# Patient Record
Sex: Female | Born: 1955 | Race: Asian | Hispanic: No | Marital: Married | Smoking: Never smoker
Health system: Southern US, Community
[De-identification: ages and names within clinical notes are randomized; demographics above are authoritative.]

## PROBLEM LIST (undated history)

## (undated) DIAGNOSIS — E119 Type 2 diabetes mellitus without complications: Secondary | ICD-10-CM

## (undated) DIAGNOSIS — I10 Essential (primary) hypertension: Secondary | ICD-10-CM

## (undated) DIAGNOSIS — E7800 Pure hypercholesterolemia, unspecified: Secondary | ICD-10-CM

## (undated) HISTORY — PX: ABDOMINAL HYSTERECTOMY: SHX81

---

## 2009-08-15 HISTORY — PX: CORONARY ARTERY BYPASS GRAFT: SHX141

## 2009-10-11 ENCOUNTER — Inpatient Hospital Stay (HOSPITAL_COMMUNITY): Admission: EM | Admit: 2009-10-11 | Discharge: 2009-10-16 | Payer: Self-pay | Admitting: Emergency Medicine

## 2009-10-14 ENCOUNTER — Ambulatory Visit: Payer: Self-pay | Admitting: Cardiothoracic Surgery

## 2009-10-15 ENCOUNTER — Encounter: Payer: Self-pay | Admitting: Cardiothoracic Surgery

## 2009-10-16 ENCOUNTER — Encounter: Payer: Self-pay | Admitting: Cardiothoracic Surgery

## 2009-11-19 ENCOUNTER — Ambulatory Visit: Payer: Self-pay | Admitting: Cardiothoracic Surgery

## 2009-12-16 ENCOUNTER — Ambulatory Visit: Payer: Self-pay | Admitting: Cardiothoracic Surgery

## 2009-12-30 ENCOUNTER — Ambulatory Visit: Payer: Self-pay | Admitting: Cardiothoracic Surgery

## 2009-12-30 ENCOUNTER — Inpatient Hospital Stay (HOSPITAL_COMMUNITY): Admission: RE | Admit: 2009-12-30 | Discharge: 2010-01-07 | Payer: Self-pay | Admitting: Cardiothoracic Surgery

## 2010-01-28 ENCOUNTER — Ambulatory Visit: Payer: Self-pay | Admitting: Cardiothoracic Surgery

## 2010-01-28 ENCOUNTER — Encounter: Admission: RE | Admit: 2010-01-28 | Discharge: 2010-01-28 | Payer: Self-pay | Admitting: Cardiothoracic Surgery

## 2010-08-18 IMAGING — CR DG CHEST 2V
2 series · 2 of 2 positions shown · non-contrast
Comparison: Chest radiograph [DATE]

CLINICAL DATA: CAD, CABG

CHEST - 2 VIEW

[w chest pa]
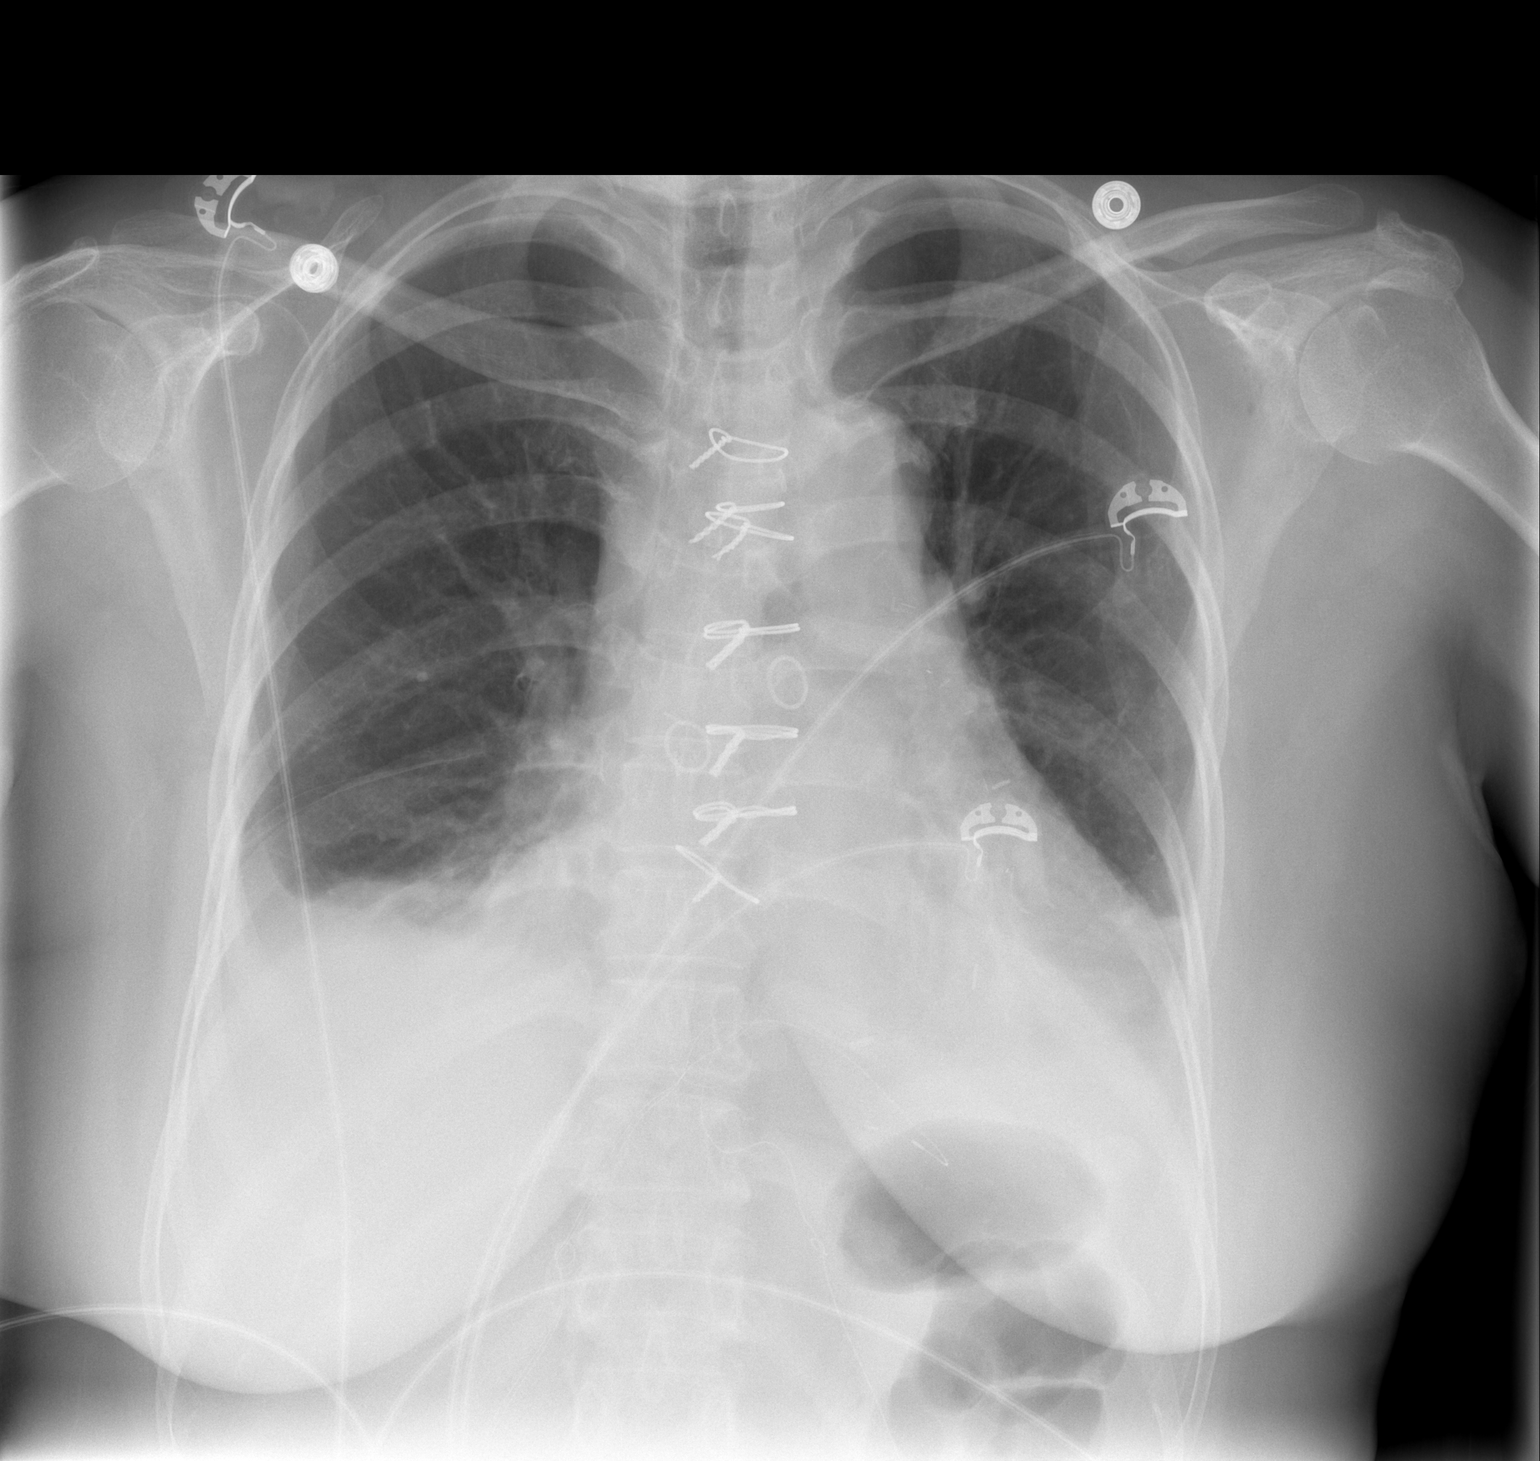

[w chest lat]
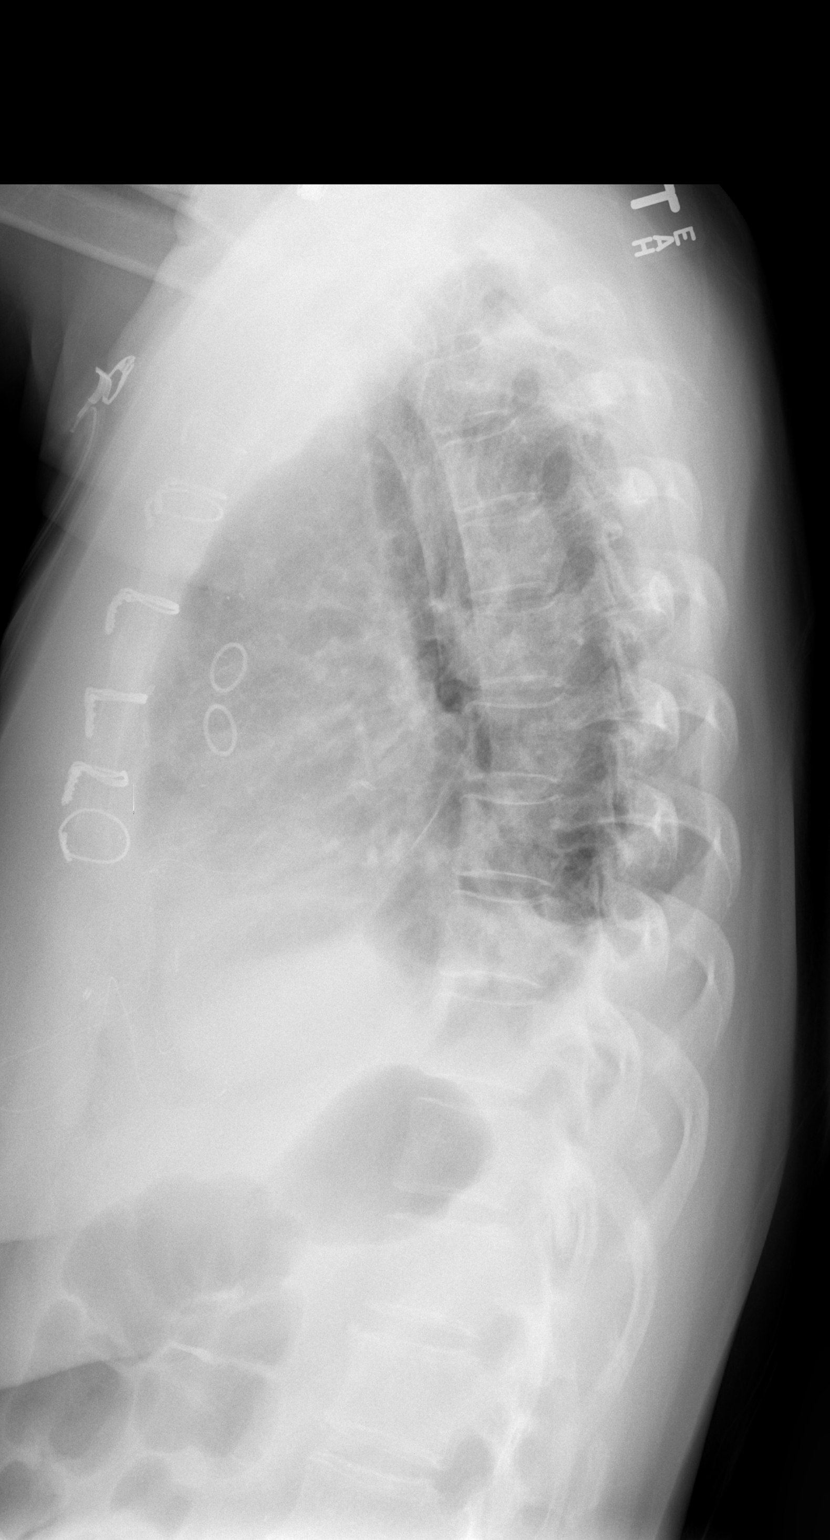

[2 of 2 positions shown; findings below may reference images not displayed]

FINDINGS: Sternotomy wires overlie stable cardiac silhouette.
Interval removal of IJ sheath.  There is improved aeration to the
lung bases.  Persistent bilateral effusions.  No pneumothorax.
IMPRESSION: Improved aeration to the lung bases.

## 2010-11-01 LAB — GLUCOSE, CAPILLARY
Glucose-Capillary: 100 mg/dL — ABNORMAL HIGH (ref 70–99)
Glucose-Capillary: 105 mg/dL — ABNORMAL HIGH (ref 70–99)
Glucose-Capillary: 108 mg/dL — ABNORMAL HIGH (ref 70–99)
Glucose-Capillary: 116 mg/dL — ABNORMAL HIGH (ref 70–99)
Glucose-Capillary: 117 mg/dL — ABNORMAL HIGH (ref 70–99)
Glucose-Capillary: 121 mg/dL — ABNORMAL HIGH (ref 70–99)
Glucose-Capillary: 122 mg/dL — ABNORMAL HIGH (ref 70–99)
Glucose-Capillary: 126 mg/dL — ABNORMAL HIGH (ref 70–99)
Glucose-Capillary: 131 mg/dL — ABNORMAL HIGH (ref 70–99)
Glucose-Capillary: 131 mg/dL — ABNORMAL HIGH (ref 70–99)
Glucose-Capillary: 133 mg/dL — ABNORMAL HIGH (ref 70–99)
Glucose-Capillary: 134 mg/dL — ABNORMAL HIGH (ref 70–99)
Glucose-Capillary: 136 mg/dL — ABNORMAL HIGH (ref 70–99)
Glucose-Capillary: 136 mg/dL — ABNORMAL HIGH (ref 70–99)
Glucose-Capillary: 137 mg/dL — ABNORMAL HIGH (ref 70–99)
Glucose-Capillary: 137 mg/dL — ABNORMAL HIGH (ref 70–99)
Glucose-Capillary: 140 mg/dL — ABNORMAL HIGH (ref 70–99)
Glucose-Capillary: 141 mg/dL — ABNORMAL HIGH (ref 70–99)
Glucose-Capillary: 143 mg/dL — ABNORMAL HIGH (ref 70–99)
Glucose-Capillary: 144 mg/dL — ABNORMAL HIGH (ref 70–99)
Glucose-Capillary: 146 mg/dL — ABNORMAL HIGH (ref 70–99)
Glucose-Capillary: 154 mg/dL — ABNORMAL HIGH (ref 70–99)
Glucose-Capillary: 155 mg/dL — ABNORMAL HIGH (ref 70–99)
Glucose-Capillary: 157 mg/dL — ABNORMAL HIGH (ref 70–99)
Glucose-Capillary: 162 mg/dL — ABNORMAL HIGH (ref 70–99)
Glucose-Capillary: 167 mg/dL — ABNORMAL HIGH (ref 70–99)
Glucose-Capillary: 176 mg/dL — ABNORMAL HIGH (ref 70–99)
Glucose-Capillary: 177 mg/dL — ABNORMAL HIGH (ref 70–99)
Glucose-Capillary: 180 mg/dL — ABNORMAL HIGH (ref 70–99)
Glucose-Capillary: 183 mg/dL — ABNORMAL HIGH (ref 70–99)
Glucose-Capillary: 235 mg/dL — ABNORMAL HIGH (ref 70–99)
Glucose-Capillary: 239 mg/dL — ABNORMAL HIGH (ref 70–99)
Glucose-Capillary: 25 mg/dL — CL (ref 70–99)
Glucose-Capillary: 50 mg/dL — ABNORMAL LOW (ref 70–99)
Glucose-Capillary: 62 mg/dL — ABNORMAL LOW (ref 70–99)
Glucose-Capillary: 63 mg/dL — ABNORMAL LOW (ref 70–99)
Glucose-Capillary: 65 mg/dL — ABNORMAL LOW (ref 70–99)
Glucose-Capillary: 80 mg/dL (ref 70–99)
Glucose-Capillary: 82 mg/dL (ref 70–99)
Glucose-Capillary: 83 mg/dL (ref 70–99)
Glucose-Capillary: 84 mg/dL (ref 70–99)
Glucose-Capillary: 89 mg/dL (ref 70–99)
Glucose-Capillary: 91 mg/dL (ref 70–99)
Glucose-Capillary: 94 mg/dL (ref 70–99)
Glucose-Capillary: 96 mg/dL (ref 70–99)

## 2010-11-01 LAB — POCT I-STAT, CHEM 8
BUN: 12 mg/dL (ref 6–23)
BUN: 16 mg/dL (ref 6–23)
Calcium, Ion: 1.09 mmol/L — ABNORMAL LOW (ref 1.12–1.32)
Calcium, Ion: 1.11 mmol/L — ABNORMAL LOW (ref 1.12–1.32)
Chloride: 103 mEq/L (ref 96–112)
Chloride: 107 mEq/L (ref 96–112)
Creatinine, Ser: 0.9 mg/dL (ref 0.4–1.2)
Creatinine, Ser: 1.2 mg/dL (ref 0.4–1.2)
Glucose, Bld: 146 mg/dL — ABNORMAL HIGH (ref 70–99)
Glucose, Bld: 157 mg/dL — ABNORMAL HIGH (ref 70–99)
HCT: 25 % — ABNORMAL LOW (ref 36.0–46.0)
HCT: 30 % — ABNORMAL LOW (ref 36.0–46.0)
Hemoglobin: 10.2 g/dL — ABNORMAL LOW (ref 12.0–15.0)
Hemoglobin: 8.5 g/dL — ABNORMAL LOW (ref 12.0–15.0)
Potassium: 4.3 mEq/L (ref 3.5–5.1)
Potassium: 4.5 mEq/L (ref 3.5–5.1)
Sodium: 139 mEq/L (ref 135–145)
Sodium: 144 mEq/L (ref 135–145)
TCO2: 23 mmol/L (ref 0–100)
TCO2: 26 mmol/L (ref 0–100)

## 2010-11-01 LAB — CBC
HCT: 23 % — ABNORMAL LOW (ref 36.0–46.0)
HCT: 23.6 % — ABNORMAL LOW (ref 36.0–46.0)
HCT: 23.8 % — ABNORMAL LOW (ref 36.0–46.0)
HCT: 24.7 % — ABNORMAL LOW (ref 36.0–46.0)
HCT: 27 % — ABNORMAL LOW (ref 36.0–46.0)
HCT: 29.3 % — ABNORMAL LOW (ref 36.0–46.0)
HCT: 34.8 % — ABNORMAL LOW (ref 36.0–46.0)
Hemoglobin: 10.1 g/dL — ABNORMAL LOW (ref 12.0–15.0)
Hemoglobin: 12.1 g/dL (ref 12.0–15.0)
Hemoglobin: 7.8 g/dL — ABNORMAL LOW (ref 12.0–15.0)
Hemoglobin: 8 g/dL — ABNORMAL LOW (ref 12.0–15.0)
Hemoglobin: 8.1 g/dL — ABNORMAL LOW (ref 12.0–15.0)
Hemoglobin: 8.3 g/dL — ABNORMAL LOW (ref 12.0–15.0)
Hemoglobin: 9.2 g/dL — ABNORMAL LOW (ref 12.0–15.0)
MCHC: 33.4 g/dL (ref 30.0–36.0)
MCHC: 34 g/dL (ref 30.0–36.0)
MCHC: 34 g/dL (ref 30.0–36.0)
MCHC: 34 g/dL (ref 30.0–36.0)
MCHC: 34.1 g/dL (ref 30.0–36.0)
MCHC: 34.6 g/dL (ref 30.0–36.0)
MCHC: 34.6 g/dL (ref 30.0–36.0)
MCV: 92.3 fL (ref 78.0–100.0)
MCV: 92.8 fL (ref 78.0–100.0)
MCV: 93 fL (ref 78.0–100.0)
MCV: 93 fL (ref 78.0–100.0)
MCV: 93.4 fL (ref 78.0–100.0)
MCV: 93.5 fL (ref 78.0–100.0)
MCV: 94.1 fL (ref 78.0–100.0)
Platelets: 125 10*3/uL — ABNORMAL LOW (ref 150–400)
Platelets: 140 10*3/uL — ABNORMAL LOW (ref 150–400)
Platelets: 153 10*3/uL (ref 150–400)
Platelets: 169 10*3/uL (ref 150–400)
Platelets: 171 10*3/uL (ref 150–400)
Platelets: 174 10*3/uL (ref 150–400)
Platelets: 287 10*3/uL (ref 150–400)
RBC: 2.44 MIL/uL — ABNORMAL LOW (ref 3.87–5.11)
RBC: 2.52 MIL/uL — ABNORMAL LOW (ref 3.87–5.11)
RBC: 2.56 MIL/uL — ABNORMAL LOW (ref 3.87–5.11)
RBC: 2.65 MIL/uL — ABNORMAL LOW (ref 3.87–5.11)
RBC: 2.91 MIL/uL — ABNORMAL LOW (ref 3.87–5.11)
RBC: 3.17 MIL/uL — ABNORMAL LOW (ref 3.87–5.11)
RBC: 3.75 MIL/uL — ABNORMAL LOW (ref 3.87–5.11)
RDW: 13.3 % (ref 11.5–15.5)
RDW: 13.6 % (ref 11.5–15.5)
RDW: 13.7 % (ref 11.5–15.5)
RDW: 14.1 % (ref 11.5–15.5)
RDW: 14.1 % (ref 11.5–15.5)
RDW: 14.3 % (ref 11.5–15.5)
RDW: 14.4 % (ref 11.5–15.5)
WBC: 10.8 10*3/uL — ABNORMAL HIGH (ref 4.0–10.5)
WBC: 13.4 10*3/uL — ABNORMAL HIGH (ref 4.0–10.5)
WBC: 15.5 10*3/uL — ABNORMAL HIGH (ref 4.0–10.5)
WBC: 16.6 10*3/uL — ABNORMAL HIGH (ref 4.0–10.5)
WBC: 17.2 10*3/uL — ABNORMAL HIGH (ref 4.0–10.5)
WBC: 18.8 10*3/uL — ABNORMAL HIGH (ref 4.0–10.5)
WBC: 9.1 10*3/uL (ref 4.0–10.5)

## 2010-11-01 LAB — POCT I-STAT 4, (NA,K, GLUC, HGB,HCT)
Glucose, Bld: 116 mg/dL — ABNORMAL HIGH (ref 70–99)
Glucose, Bld: 118 mg/dL — ABNORMAL HIGH (ref 70–99)
Glucose, Bld: 121 mg/dL — ABNORMAL HIGH (ref 70–99)
Glucose, Bld: 148 mg/dL — ABNORMAL HIGH (ref 70–99)
Glucose, Bld: 88 mg/dL (ref 70–99)
Glucose, Bld: 99 mg/dL (ref 70–99)
HCT: 19 % — ABNORMAL LOW (ref 36.0–46.0)
HCT: 19 % — ABNORMAL LOW (ref 36.0–46.0)
HCT: 26 % — ABNORMAL LOW (ref 36.0–46.0)
HCT: 26 % — ABNORMAL LOW (ref 36.0–46.0)
HCT: 26 % — ABNORMAL LOW (ref 36.0–46.0)
HCT: 39 % (ref 36.0–46.0)
Hemoglobin: 13.3 g/dL (ref 12.0–15.0)
Hemoglobin: 6.5 g/dL — CL (ref 12.0–15.0)
Hemoglobin: 6.5 g/dL — CL (ref 12.0–15.0)
Hemoglobin: 8.8 g/dL — ABNORMAL LOW (ref 12.0–15.0)
Hemoglobin: 8.8 g/dL — ABNORMAL LOW (ref 12.0–15.0)
Hemoglobin: 8.8 g/dL — ABNORMAL LOW (ref 12.0–15.0)
Potassium: 3.7 mEq/L (ref 3.5–5.1)
Potassium: 4 mEq/L (ref 3.5–5.1)
Potassium: 4 mEq/L (ref 3.5–5.1)
Potassium: 4.5 mEq/L (ref 3.5–5.1)
Potassium: 4.9 mEq/L (ref 3.5–5.1)
Potassium: 5.8 mEq/L — ABNORMAL HIGH (ref 3.5–5.1)
Sodium: 132 mEq/L — ABNORMAL LOW (ref 135–145)
Sodium: 134 mEq/L — ABNORMAL LOW (ref 135–145)
Sodium: 138 mEq/L (ref 135–145)
Sodium: 141 mEq/L (ref 135–145)
Sodium: 142 mEq/L (ref 135–145)
Sodium: 143 mEq/L (ref 135–145)

## 2010-11-01 LAB — BASIC METABOLIC PANEL
BUN: 11 mg/dL (ref 6–23)
BUN: 13 mg/dL (ref 6–23)
BUN: 13 mg/dL (ref 6–23)
BUN: 13 mg/dL (ref 6–23)
BUN: 14 mg/dL (ref 6–23)
BUN: 15 mg/dL (ref 6–23)
CO2: 24 mEq/L (ref 19–32)
CO2: 26 mEq/L (ref 19–32)
CO2: 27 mEq/L (ref 19–32)
CO2: 30 mEq/L (ref 19–32)
CO2: 30 mEq/L (ref 19–32)
CO2: 32 mEq/L (ref 19–32)
Calcium: 7.7 mg/dL — ABNORMAL LOW (ref 8.4–10.5)
Calcium: 8 mg/dL — ABNORMAL LOW (ref 8.4–10.5)
Calcium: 8.1 mg/dL — ABNORMAL LOW (ref 8.4–10.5)
Calcium: 8.3 mg/dL — ABNORMAL LOW (ref 8.4–10.5)
Calcium: 8.6 mg/dL (ref 8.4–10.5)
Calcium: 9 mg/dL (ref 8.4–10.5)
Chloride: 100 mEq/L (ref 96–112)
Chloride: 100 mEq/L (ref 96–112)
Chloride: 101 mEq/L (ref 96–112)
Chloride: 104 mEq/L (ref 96–112)
Chloride: 105 mEq/L (ref 96–112)
Chloride: 113 mEq/L — ABNORMAL HIGH (ref 96–112)
Creatinine, Ser: 0.91 mg/dL (ref 0.4–1.2)
Creatinine, Ser: 0.98 mg/dL (ref 0.4–1.2)
Creatinine, Ser: 1.01 mg/dL (ref 0.4–1.2)
Creatinine, Ser: 1.08 mg/dL (ref 0.4–1.2)
Creatinine, Ser: 1.1 mg/dL (ref 0.4–1.2)
Creatinine, Ser: 1.13 mg/dL (ref 0.4–1.2)
GFR calc Af Amer: >60 mL/min (ref >60)
GFR calc Af Amer: >60 mL/min (ref >60)
GFR calc Af Amer: >60 mL/min (ref >60)
GFR calc Af Amer: >60 mL/min (ref >60)
GFR calc Af Amer: >60 mL/min (ref >60)
GFR calc Af Amer: >60 mL/min (ref >60)
GFR calc non Af Amer: 50 mL/min — ABNORMAL LOW (ref >60)
GFR calc non Af Amer: 52 mL/min — ABNORMAL LOW (ref >60)
GFR calc non Af Amer: 53 mL/min — ABNORMAL LOW (ref >60)
GFR calc non Af Amer: 57 mL/min — ABNORMAL LOW (ref >60)
GFR calc non Af Amer: 59 mL/min — ABNORMAL LOW (ref >60)
GFR calc non Af Amer: >60 mL/min (ref >60)
Glucose, Bld: 120 mg/dL — ABNORMAL HIGH (ref 70–99)
Glucose, Bld: 151 mg/dL — ABNORMAL HIGH (ref 70–99)
Glucose, Bld: 82 mg/dL (ref 70–99)
Glucose, Bld: 94 mg/dL (ref 70–99)
Glucose, Bld: 96 mg/dL (ref 70–99)
Glucose, Bld: 99 mg/dL (ref 70–99)
Potassium: 3.6 mEq/L (ref 3.5–5.1)
Potassium: 3.6 mEq/L (ref 3.5–5.1)
Potassium: 3.7 mEq/L (ref 3.5–5.1)
Potassium: 3.9 mEq/L (ref 3.5–5.1)
Potassium: 4.2 mEq/L (ref 3.5–5.1)
Potassium: 4.3 mEq/L (ref 3.5–5.1)
Sodium: 136 mEq/L (ref 135–145)
Sodium: 137 mEq/L (ref 135–145)
Sodium: 140 mEq/L (ref 135–145)
Sodium: 141 mEq/L (ref 135–145)
Sodium: 141 mEq/L (ref 135–145)
Sodium: 142 mEq/L (ref 135–145)

## 2010-11-01 LAB — COMPREHENSIVE METABOLIC PANEL
ALT: 10 U/L (ref 0–35)
AST: 20 U/L (ref 0–37)
Albumin: 4 g/dL (ref 3.5–5.2)
Alkaline Phosphatase: 59 U/L (ref 39–117)
BUN: 22 mg/dL (ref 6–23)
CO2: 21 mEq/L (ref 19–32)
Calcium: 9.6 mg/dL (ref 8.4–10.5)
Chloride: 108 mEq/L (ref 96–112)
Creatinine, Ser: 0.81 mg/dL (ref 0.4–1.2)
GFR calc Af Amer: >60 mL/min (ref >60)
GFR calc non Af Amer: >60 mL/min (ref >60)
Glucose, Bld: 101 mg/dL — ABNORMAL HIGH (ref 70–99)
Potassium: 4.4 mEq/L (ref 3.5–5.1)
Sodium: 139 mEq/L (ref 135–145)
Total Bilirubin: 0.7 mg/dL (ref 0.3–1.2)
Total Protein: 7.7 g/dL (ref 6.0–8.3)

## 2010-11-01 LAB — POCT I-STAT 3, VENOUS BLOOD GAS (G3P V)
Acid-base deficit: 1 mmol/L (ref 0.0–2.0)
Bicarbonate: 23.1 mEq/L (ref 20.0–24.0)
O2 Saturation: 83 %
Patient temperature: 31
TCO2: 24 mmol/L (ref 0–100)
pCO2, Ven: 25.7 mmHg — ABNORMAL LOW (ref 45.0–50.0)
pH, Ven: 7.538 — ABNORMAL HIGH (ref 7.250–7.300)
pO2, Ven: 29 mmHg — CL (ref 30.0–45.0)

## 2010-11-01 LAB — PROTIME-INR
INR: 0.93 (ref 0.00–1.49)
INR: 1.47 (ref 0.00–1.49)
Prothrombin Time: 12.4 seconds (ref 11.6–15.2)
Prothrombin Time: 17.7 seconds — ABNORMAL HIGH (ref 11.6–15.2)

## 2010-11-01 LAB — POCT I-STAT 3, ART BLOOD GAS (G3+)
Acid-Base Excess: 2 mmol/L (ref 0.0–2.0)
Acid-base deficit: 2 mmol/L (ref 0.0–2.0)
Acid-base deficit: 2 mmol/L (ref 0.0–2.0)
Acid-base deficit: 3 mmol/L — ABNORMAL HIGH (ref 0.0–2.0)
Bicarbonate: 22.4 mEq/L (ref 20.0–24.0)
Bicarbonate: 22.8 mEq/L (ref 20.0–24.0)
Bicarbonate: 23.1 mEq/L (ref 20.0–24.0)
Bicarbonate: 25.4 mEq/L — ABNORMAL HIGH (ref 20.0–24.0)
O2 Saturation: 100 %
O2 Saturation: 98 %
O2 Saturation: 99 %
O2 Saturation: 99 %
Patient temperature: 31
Patient temperature: 35
Patient temperature: 37.1
Patient temperature: 98.7
TCO2: 24 mmol/L (ref 0–100)
TCO2: 24 mmol/L (ref 0–100)
TCO2: 24 mmol/L (ref 0–100)
TCO2: 26 mmol/L (ref 0–100)
pCO2 arterial: 24.2 mmHg — ABNORMAL LOW (ref 35.0–45.0)
pCO2 arterial: 36 mmHg (ref 35.0–45.0)
pCO2 arterial: 39.1 mmHg (ref 35.0–45.0)
pCO2 arterial: 40.4 mmHg (ref 35.0–45.0)
pH, Arterial: 7.36 (ref 7.350–7.400)
pH, Arterial: 7.365 (ref 7.350–7.400)
pH, Arterial: 7.406 — ABNORMAL HIGH (ref 7.350–7.400)
pH, Arterial: 7.608 (ref 7.350–7.400)
pO2, Arterial: 106 mmHg — ABNORMAL HIGH (ref 80.0–100.0)
pO2, Arterial: 107 mmHg — ABNORMAL HIGH (ref 80.0–100.0)
pO2, Arterial: 122 mmHg — ABNORMAL HIGH (ref 80.0–100.0)
pO2, Arterial: 244 mmHg — ABNORMAL HIGH (ref 80.0–100.0)

## 2010-11-01 LAB — URINALYSIS, ROUTINE W REFLEX MICROSCOPIC
Bilirubin Urine: NEGATIVE
Glucose, UA: NEGATIVE mg/dL
Ketones, ur: NEGATIVE mg/dL
Nitrite: NEGATIVE
Protein, ur: NEGATIVE mg/dL
Specific Gravity, Urine: 1.014 (ref 1.005–1.030)
Urobilinogen, UA: 0.2 mg/dL (ref 0.0–1.0)
pH: 6 (ref 5.0–8.0)

## 2010-11-01 LAB — BLOOD GAS, ARTERIAL
Acid-base deficit: 1.2 mmol/L (ref 0.0–2.0)
Bicarbonate: 23 mEq/L (ref 20.0–24.0)
Drawn by: 206361
FIO2: 0.21 %
O2 Saturation: 95.9 %
Patient temperature: 98.6
TCO2: 24.2 mmol/L (ref 0–100)
pCO2 arterial: 38.4 mmHg (ref 35.0–45.0)
pH, Arterial: 7.396 (ref 7.350–7.400)
pO2, Arterial: 77.2 mmHg — ABNORMAL LOW (ref 80.0–100.0)

## 2010-11-01 LAB — TYPE AND SCREEN
ABO/RH(D): O POS
Antibody Screen: NEGATIVE

## 2010-11-01 LAB — MAGNESIUM
Magnesium: 2 mg/dL (ref 1.5–2.5)
Magnesium: 2.7 mg/dL — ABNORMAL HIGH (ref 1.5–2.5)

## 2010-11-01 LAB — CLOSTRIDIUM DIFFICILE EIA

## 2010-11-01 LAB — SURGICAL PCR SCREEN
MRSA, PCR: POSITIVE — AB
Staphylococcus aureus: POSITIVE — AB

## 2010-11-01 LAB — HEMOGLOBIN A1C
Hgb A1c MFr Bld: 6.6 % — ABNORMAL HIGH (ref <5.7)
Mean Plasma Glucose: 143 mg/dL — ABNORMAL HIGH (ref <117)

## 2010-11-01 LAB — CREATININE, SERUM
Creatinine, Ser: 0.83 mg/dL (ref 0.4–1.2)
GFR calc Af Amer: >60 mL/min (ref >60)
GFR calc non Af Amer: >60 mL/min (ref >60)

## 2010-11-01 LAB — URINE MICROSCOPIC-ADD ON

## 2010-11-01 LAB — APTT
aPTT: 37 seconds (ref 24–37)
aPTT: 44 seconds — ABNORMAL HIGH (ref 24–37)

## 2010-11-01 LAB — HEMOGLOBIN AND HEMATOCRIT, BLOOD
HCT: 16.9 % — ABNORMAL LOW (ref 36.0–46.0)
Hemoglobin: 5.8 g/dL — CL (ref 12.0–15.0)

## 2010-11-01 LAB — PREPARE RBC (CROSSMATCH)

## 2010-11-01 LAB — ABO/RH: ABO/RH(D): O POS

## 2010-11-01 LAB — PLATELET COUNT: Platelets: 116 10*3/uL — ABNORMAL LOW (ref 150–400)

## 2010-11-04 LAB — BASIC METABOLIC PANEL
BUN: 15 mg/dL (ref 6–23)
CO2: 25 mEq/L (ref 19–32)
CO2: 25 mEq/L (ref 19–32)
Chloride: 106 mEq/L (ref 96–112)
Creatinine, Ser: 0.93 mg/dL (ref 0.4–1.2)
GFR calc Af Amer: >60 mL/min (ref >60)
GFR calc non Af Amer: 58 mL/min — ABNORMAL LOW (ref >60)
Glucose, Bld: 123 mg/dL — ABNORMAL HIGH (ref 70–99)
Potassium: 4.1 mEq/L (ref 3.5–5.1)

## 2010-11-04 LAB — POCT CARDIAC MARKERS
CKMB, poc: 1.4 ng/mL (ref 1.0–8.0)
Myoglobin, poc: 55.2 ng/mL (ref 12–200)
Troponin i, poc: <0.05 ng/mL (ref 0.00–0.09)

## 2010-11-04 LAB — URINALYSIS, ROUTINE W REFLEX MICROSCOPIC
Glucose, UA: 250 mg/dL — AB
Specific Gravity, Urine: 1.011 (ref 1.005–1.030)
pH: 5.5 (ref 5.0–8.0)

## 2010-11-04 LAB — CARDIAC PANEL(CRET KIN+CKTOT+MB+TROPI)
Relative Index: INVALID (ref 0.0–2.5)
Relative Index: INVALID (ref 0.0–2.5)
Total CK: 37 U/L (ref 7–177)
Total CK: 42 U/L (ref 7–177)
Troponin I: 0.1 ng/mL — ABNORMAL HIGH (ref 0.00–0.06)

## 2010-11-04 LAB — URINE MICROSCOPIC-ADD ON

## 2010-11-04 LAB — CBC
HCT: 31.3 % — ABNORMAL LOW (ref 36.0–46.0)
MCHC: 33.9 g/dL (ref 30.0–36.0)
MCHC: 34.2 g/dL (ref 30.0–36.0)
MCV: 92.7 fL (ref 78.0–100.0)
MCV: 92.9 fL (ref 78.0–100.0)
Platelets: 320 10*3/uL (ref 150–400)
RBC: 3.2 MIL/uL — ABNORMAL LOW (ref 3.87–5.11)
RDW: 13.2 % (ref 11.5–15.5)

## 2010-11-04 LAB — URINE CULTURE

## 2010-11-04 LAB — LIPID PANEL
Total CHOL/HDL Ratio: 4.3 RATIO
VLDL: 42 mg/dL — ABNORMAL HIGH (ref 0–40)

## 2010-11-04 LAB — GLUCOSE, CAPILLARY
Glucose-Capillary: 110 mg/dL — ABNORMAL HIGH (ref 70–99)
Glucose-Capillary: 112 mg/dL — ABNORMAL HIGH (ref 70–99)
Glucose-Capillary: 115 mg/dL — ABNORMAL HIGH (ref 70–99)
Glucose-Capillary: 128 mg/dL — ABNORMAL HIGH (ref 70–99)
Glucose-Capillary: 151 mg/dL — ABNORMAL HIGH (ref 70–99)

## 2010-11-04 LAB — DIFFERENTIAL
Basophils Relative: 0 % (ref 0–1)
Eosinophils Absolute: 0.2 10*3/uL (ref 0.0–0.7)
Monocytes Absolute: 0.8 10*3/uL (ref 0.1–1.0)
Monocytes Relative: 9 % (ref 3–12)
Neutrophils Relative %: 69 % (ref 43–77)

## 2010-11-04 LAB — TSH: TSH: 0.879 u[IU]/mL (ref 0.350–4.500)

## 2010-11-08 LAB — BASIC METABOLIC PANEL
BUN: 20 mg/dL (ref 6–23)
BUN: 21 mg/dL (ref 6–23)
CO2: 22 mEq/L (ref 19–32)
Calcium: 8.8 mg/dL (ref 8.4–10.5)
Chloride: 106 mEq/L (ref 96–112)
Chloride: 107 mEq/L (ref 96–112)
Chloride: 110 mEq/L (ref 96–112)
GFR calc Af Amer: 54 mL/min — ABNORMAL LOW (ref >60)
GFR calc Af Amer: >60 mL/min (ref >60)
GFR calc non Af Amer: 58 mL/min — ABNORMAL LOW (ref >60)
Glucose, Bld: 202 mg/dL — ABNORMAL HIGH (ref 70–99)
Potassium: 3.7 mEq/L (ref 3.5–5.1)
Potassium: 3.9 mEq/L (ref 3.5–5.1)
Potassium: 4.5 mEq/L (ref 3.5–5.1)
Sodium: 136 mEq/L (ref 135–145)
Sodium: 137 mEq/L (ref 135–145)
Sodium: 141 mEq/L (ref 135–145)

## 2010-11-08 LAB — CBC
HCT: 31.5 % — ABNORMAL LOW (ref 36.0–46.0)
HCT: 32.2 % — ABNORMAL LOW (ref 36.0–46.0)
Hemoglobin: 10.8 g/dL — ABNORMAL LOW (ref 12.0–15.0)
Hemoglobin: 11 g/dL — ABNORMAL LOW (ref 12.0–15.0)
Hemoglobin: 11.1 g/dL — ABNORMAL LOW (ref 12.0–15.0)
MCHC: 33.7 g/dL (ref 30.0–36.0)
MCHC: 34.5 g/dL (ref 30.0–36.0)
MCV: 92.2 fL (ref 78.0–100.0)
MCV: 92.6 fL (ref 78.0–100.0)
Platelets: 320 10*3/uL (ref 150–400)
RBC: 3.49 MIL/uL — ABNORMAL LOW (ref 3.87–5.11)
RDW: 13.4 % (ref 11.5–15.5)
RDW: 13.6 % (ref 11.5–15.5)
WBC: 7.2 10*3/uL (ref 4.0–10.5)
WBC: 9.3 10*3/uL (ref 4.0–10.5)

## 2010-11-08 LAB — CARDIAC PANEL(CRET KIN+CKTOT+MB+TROPI)
CK, MB: 1.1 ng/mL (ref 0.3–4.0)
CK, MB: 1.4 ng/mL (ref 0.3–4.0)
Total CK: 29 U/L (ref 7–177)
Total CK: 33 U/L (ref 7–177)
Troponin I: 0.51 ng/mL (ref 0.00–0.06)

## 2010-11-08 LAB — GLUCOSE, CAPILLARY
Glucose-Capillary: 141 mg/dL — ABNORMAL HIGH (ref 70–99)
Glucose-Capillary: 159 mg/dL — ABNORMAL HIGH (ref 70–99)
Glucose-Capillary: 201 mg/dL — ABNORMAL HIGH (ref 70–99)
Glucose-Capillary: 215 mg/dL — ABNORMAL HIGH (ref 70–99)

## 2010-12-28 NOTE — Assessment & Plan Note (Signed)
OFFICE VISIT   [name], [name]  DOB:  Feb 06, 1956                                        November 19, 2009  CHART #:  [id number]   The patient comes to the office today in follow up after her recent  angioplasty and cardiac surgery consultation on October 11, 2009.  On  March 1. 2011, she underwent cardiac catheterization subsequently by Dr.  Armanda Magic and then subsequently discovery of acute subtotal occlusion  of his distal circumflex.  A nondrug-coated stent was placed at that  time.  She also was found to have significant concomitant coronary  disease.  The patient had been loaded with Plavix and it was the  decision to let her recover from the acute event.  Continue on Plavix  for at least a month with a bare-metal stent, then stop the Plavix and  proceed with elective coronary artery bypass grafting.  She comes to the  office today to discuss proceeding with coronary bypass grafting.  I  have offered to stop her Plavix tomorrow and proceed with surgery next  week.  In discussion with her and her family, they have decided they  would like to wait several more weeks until they can make arrangements  for family to come from [place] to stay with her postop as both her  daughter and son-in-law work full time.  Currently, she is asymptomatic.  She has had no evidence of congestive heart failure or any further  angina.   On exam, her blood pressure is 129/87, pulse is 112, respiratory rate  16, O2 sat is 99%.  She is awake, alert, and neurologically intact.  Her  son-in-law speaks [place] well and is able to communicate in [place]  with her.  I do not hear any carotid bruits.  Her lungs are clear  bilaterally.  Cardiac exam reveals regular rate and rhythm without  murmur or gallop.  Abdominal exam is benign.  Lower extremities are  without pedal edema.   We reviewed her medication list with her.  She is continuing to take her  Plavix and also metformin,  Amaryl, Imdur, ramipril, metoprolol, and 81  mg of aspirin.  She of her medications though she was not sure which one  were almost out.  She will contact her pharmacist to contact Dr. Mayford Knife  to renew her prescriptions.  I will plan to see her in the [facility]  office in about 3-4  weeks and then plan on surgery in the middle of May.  We will need to  stop her Plavix prior to this.   Sheliah Plane, MD  Electronically Signed   EG/MEDQ  D:  11/19/2009  T:  11/20/2009  Job:  [id number]   cc:   Armanda Magic, M.D.

## 2010-12-28 NOTE — Assessment & Plan Note (Signed)
OFFICE VISIT   Dornbush, [name] [name]  DOB:  Jan 25, 1956                                        January 28, 2010  CHART #:  [id number]   HISTORY:  The patient returns to the office today in followup after her  coronary artery bypass grafting x5 with right leg vein harvesting.  The  patient is a 55 year old [place] lady who had had a stent placed in  the distal circumflex and was started on Plavix and then ultimately  returned for coronary artery bypass grafting.  She has done well  postoperatively with no recurrent angina or evidence of congestive heart  failure.  She is increasing her physical activity appropriately.  She  comes to the office today with her son-in-law who translates for.   PHYSICAL EXAMINATION:  Today, her blood pressure 123/84, pulse 94,  respiratory rate 16, and O2 sats 98%.  Sternum is stable and well  healed.  Lungs are clear bilaterally.  The right endovein harvest sites  are well healed without any pedal edema or erythema.   DIAGNOSTIC TESTS:  Followup chest x-ray shows clear lung fields  bilaterally.   MEDICATIONS:  She continues on:  1. Aspirin 81 mg a day.  2. Metoprolol 25 twice a day.  3. Amaryl 4 mg a day.  4. Metformin 850 b.i.d.  5. Lipitor 20 mg a day.  6. Pioglitazone 15 mg a day.   IMPRESSION:  Overall, I am pleased with her progress postoperatively.  I  have discussed with her enrolling in the cardiac rehab program some time  next week.  She does not drive, but her son-in-law is able to bring her  back and forth to cardiac rehab.  Overall, I am pleased with her  progress.  I have not made a return appointment for see me, but would be  glad to see her at her or Dr. Norris Cross request at anytime.   Sheliah Plane, MD  Electronically Signed   EG/MEDQ  D:  01/28/2010  T:  01/29/2010  Job:  [id number]   cc:   Armanda Magic, M.D.

## 2021-05-03 ENCOUNTER — Inpatient Hospital Stay (HOSPITAL_BASED_OUTPATIENT_CLINIC_OR_DEPARTMENT_OTHER)
Admission: EM | Admit: 2021-05-03 | Discharge: 2021-05-04 | DRG: 640 | Disposition: A | Discharge status: Home / Self Care | Payer: Medicaid Other | Attending: Internal Medicine | Admitting: Internal Medicine

## 2021-05-03 ENCOUNTER — Emergency Department (HOSPITAL_BASED_OUTPATIENT_CLINIC_OR_DEPARTMENT_OTHER): Payer: Medicaid Other

## 2021-05-03 ENCOUNTER — Encounter (HOSPITAL_COMMUNITY): Payer: Self-pay | Admitting: Internal Medicine

## 2021-05-03 DIAGNOSIS — E860 Dehydration: Secondary | ICD-10-CM | POA: Diagnosis present

## 2021-05-03 DIAGNOSIS — I2510 Atherosclerotic heart disease of native coronary artery without angina pectoris: Secondary | ICD-10-CM | POA: Diagnosis present

## 2021-05-03 DIAGNOSIS — E119 Type 2 diabetes mellitus without complications: Secondary | ICD-10-CM | POA: Diagnosis present

## 2021-05-03 DIAGNOSIS — J1282 Pneumonia due to coronavirus disease 2019: Secondary | ICD-10-CM | POA: Diagnosis present

## 2021-05-03 DIAGNOSIS — I10 Essential (primary) hypertension: Secondary | ICD-10-CM | POA: Diagnosis present

## 2021-05-03 DIAGNOSIS — E7800 Pure hypercholesterolemia, unspecified: Secondary | ICD-10-CM | POA: Diagnosis present

## 2021-05-03 DIAGNOSIS — E861 Hypovolemia: Secondary | ICD-10-CM | POA: Diagnosis present

## 2021-05-03 DIAGNOSIS — U071 COVID-19: Secondary | ICD-10-CM | POA: Diagnosis present

## 2021-05-03 DIAGNOSIS — Z7984 Long term (current) use of oral hypoglycemic drugs: Secondary | ICD-10-CM | POA: Diagnosis not present

## 2021-05-03 DIAGNOSIS — N179 Acute kidney failure, unspecified: Secondary | ICD-10-CM | POA: Diagnosis present

## 2021-05-03 DIAGNOSIS — E871 Hypo-osmolality and hyponatremia: Secondary | ICD-10-CM | POA: Diagnosis present

## 2021-05-03 DIAGNOSIS — R0602 Shortness of breath: Secondary | ICD-10-CM | POA: Diagnosis present

## 2021-05-03 DIAGNOSIS — Z951 Presence of aortocoronary bypass graft: Secondary | ICD-10-CM

## 2021-05-03 DIAGNOSIS — J069 Acute upper respiratory infection, unspecified: Secondary | ICD-10-CM | POA: Diagnosis present

## 2021-05-03 HISTORY — DX: Pure hypercholesterolemia, unspecified: E78.00

## 2021-05-03 HISTORY — DX: Type 2 diabetes mellitus without complications: E11.9

## 2021-05-03 HISTORY — DX: Essential (primary) hypertension: I10

## 2021-05-03 LAB — CBC WITH DIFFERENTIAL/PLATELET
Abs Immature Granulocytes: 0.04 10*3/uL (ref 0.00–0.07)
Basophils Absolute: 0 10*3/uL (ref 0.0–0.1)
Basophils Relative: 1 %
Eosinophils Absolute: 0.1 10*3/uL (ref 0.0–0.5)
Eosinophils Relative: 3 %
HCT: 32.1 % — ABNORMAL LOW (ref 36.0–46.0)
Hemoglobin: 11 g/dL — ABNORMAL LOW (ref 12.0–15.0)
Immature Granulocytes: 1 %
Lymphocytes Relative: 32 %
Lymphs Abs: 1.3 10*3/uL (ref 0.7–4.0)
MCH: 31.3 pg (ref 26.0–34.0)
MCHC: 34.3 g/dL (ref 30.0–36.0)
MCV: 91.2 fL (ref 80.0–100.0)
Monocytes Absolute: 0.5 10*3/uL (ref 0.1–1.0)
Monocytes Relative: 14 %
Neutro Abs: 2 10*3/uL (ref 1.7–7.7)
Neutrophils Relative %: 49 %
Platelets: 215 10*3/uL (ref 150–400)
RBC: 3.52 MIL/uL — ABNORMAL LOW (ref 3.87–5.11)
RDW: 12.6 % (ref 11.5–15.5)
WBC: 3.9 10*3/uL — ABNORMAL LOW (ref 4.0–10.5)
nRBC: 0 % (ref 0.0–0.2)

## 2021-05-03 LAB — COMPREHENSIVE METABOLIC PANEL
ALT: 15 U/L (ref 0–44)
ALT: 15 U/L (ref 0–44)
AST: 35 U/L (ref 15–41)
AST: 36 U/L (ref 15–41)
Albumin: 3.7 g/dL (ref 3.5–5.0)
Albumin: 3.8 g/dL (ref 3.5–5.0)
Alkaline Phosphatase: 45 U/L (ref 38–126)
Alkaline Phosphatase: 46 U/L (ref 38–126)
Anion gap: 10 (ref 5–15)
Anion gap: 10 (ref 5–15)
BUN: 25 mg/dL — ABNORMAL HIGH (ref 8–23)
BUN: 21 mg/dL (ref 8–23)
CO2: 21 mmol/L — ABNORMAL LOW (ref 22–32)
CO2: 22 mmol/L (ref 22–32)
Calcium: 8.6 mg/dL — ABNORMAL LOW (ref 8.9–10.3)
Calcium: 9 mg/dL (ref 8.9–10.3)
Chloride: 87 mmol/L — ABNORMAL LOW (ref 98–111)
Chloride: 94 mmol/L — ABNORMAL LOW (ref 98–111)
Creatinine, Ser: 1.62 mg/dL — ABNORMAL HIGH (ref 0.44–1.00)
Creatinine, Ser: 1.47 mg/dL — ABNORMAL HIGH (ref 0.44–1.00)
GFR, Estimated: 35 mL/min — ABNORMAL LOW (ref >60)
GFR, Estimated: 39 mL/min — ABNORMAL LOW (ref >60)
Glucose, Bld: 79 mg/dL (ref 70–99)
Glucose, Bld: 110 mg/dL — ABNORMAL HIGH (ref 70–99)
Potassium: 4.4 mmol/L (ref 3.5–5.1)
Potassium: 4.5 mmol/L (ref 3.5–5.1)
Sodium: 118 mmol/L — CL (ref 135–145)
Sodium: 126 mmol/L — ABNORMAL LOW (ref 135–145)
Total Bilirubin: 0.3 mg/dL (ref 0.3–1.2)
Total Bilirubin: 0.6 mg/dL (ref 0.3–1.2)
Total Protein: 7.2 g/dL (ref 6.5–8.1)
Total Protein: 7.6 g/dL (ref 6.5–8.1)

## 2021-05-03 LAB — D-DIMER, QUANTITATIVE: D-Dimer, Quant: 0.62 ug/mL-FEU — ABNORMAL HIGH (ref 0.00–0.50)

## 2021-05-03 LAB — BASIC METABOLIC PANEL
Anion gap: 10 (ref 5–15)
BUN: 20 mg/dL (ref 8–23)
CO2: 21 mmol/L — ABNORMAL LOW (ref 22–32)
Calcium: 8.5 mg/dL — ABNORMAL LOW (ref 8.9–10.3)
Chloride: 89 mmol/L — ABNORMAL LOW (ref 98–111)
Creatinine, Ser: 1.33 mg/dL — ABNORMAL HIGH (ref 0.44–1.00)
GFR, Estimated: 44 mL/min — ABNORMAL LOW (ref >60)
Glucose, Bld: 110 mg/dL — ABNORMAL HIGH (ref 70–99)
Potassium: 4.5 mmol/L (ref 3.5–5.1)
Sodium: 120 mmol/L — ABNORMAL LOW (ref 135–145)

## 2021-05-03 LAB — TROPONIN I (HIGH SENSITIVITY)
Troponin I (High Sensitivity): 7 ng/L (ref <18)
Troponin I (High Sensitivity): 7 ng/L (ref <18)

## 2021-05-03 LAB — HIV ANTIBODY (ROUTINE TESTING W REFLEX): HIV Screen 4th Generation wRfx: NONREACTIVE

## 2021-05-03 LAB — C-REACTIVE PROTEIN: CRP: <0.5 mg/dL (ref <1.0)

## 2021-05-03 LAB — SODIUM, URINE, RANDOM: Sodium, Ur: 66 mmol/L

## 2021-05-03 LAB — FERRITIN: Ferritin: 83 ng/mL (ref 11–307)

## 2021-05-03 LAB — OSMOLALITY, URINE: Osmolality, Ur: 200 mOsm/kg — ABNORMAL LOW (ref 300–900)

## 2021-05-03 LAB — HEMOGLOBIN A1C
Hgb A1c MFr Bld: 6.3 % — ABNORMAL HIGH (ref 4.8–5.6)
Mean Plasma Glucose: 134.11 mg/dL

## 2021-05-03 LAB — BRAIN NATRIURETIC PEPTIDE: B Natriuretic Peptide: 88.7 pg/mL (ref 0.0–100.0)

## 2021-05-03 LAB — GLUCOSE, CAPILLARY
Glucose-Capillary: 104 mg/dL — ABNORMAL HIGH (ref 70–99)
Glucose-Capillary: 137 mg/dL — ABNORMAL HIGH (ref 70–99)

## 2021-05-03 MED ORDER — ZINC SULFATE 220 (50 ZN) MG PO CAPS
220.0000 mg | ORAL_CAPSULE | Freq: Every day | ORAL | Status: DC
  Administered 2021-05-03 – 2021-05-04 (×2): 220 mg via ORAL
  Filled 2021-05-03 (×2): qty 1

## 2021-05-03 MED ORDER — GUAIFENESIN-DM 100-10 MG/5ML PO SYRP: 10.0000 mL | ORAL_SOLUTION | ORAL | Status: DC | PRN

## 2021-05-03 MED ORDER — ASCORBIC ACID 500 MG PO TABS
500.0000 mg | ORAL_TABLET | Freq: Every day | ORAL | Status: DC
  Administered 2021-05-03 – 2021-05-04 (×2): 500 mg via ORAL
  Filled 2021-05-03 (×2): qty 1

## 2021-05-03 MED ORDER — SODIUM CHLORIDE 0.9 % IV SOLN: INTRAVENOUS | Status: DC

## 2021-05-03 MED ORDER — INSULIN ASPART 100 UNIT/ML IJ SOLN: 0.0000 [IU] | Freq: Three times a day (TID) | INTRAMUSCULAR | Status: DC

## 2021-05-03 MED ORDER — METOPROLOL TARTRATE 5 MG/5ML IV SOLN: 5.0000 mg | Freq: Four times a day (QID) | INTRAVENOUS | Status: DC | PRN

## 2021-05-03 MED ORDER — IPRATROPIUM-ALBUTEROL 20-100 MCG/ACT IN AERS
1.0000 | INHALATION_SPRAY | Freq: Four times a day (QID) | RESPIRATORY_TRACT | Status: DC
  Administered 2021-05-03 – 2021-05-04 (×2): 1 via RESPIRATORY_TRACT
  Filled 2021-05-03: qty 4

## 2021-05-03 MED ORDER — HYDROCODONE-ACETAMINOPHEN 5-325 MG PO TABS: 1.0000 | ORAL_TABLET | ORAL | Status: DC | PRN

## 2021-05-03 MED ORDER — ENOXAPARIN SODIUM 40 MG/0.4ML IJ SOSY
40.0000 mg | PREFILLED_SYRINGE | INTRAMUSCULAR | Status: DC
  Administered 2021-05-03: 40 mg via SUBCUTANEOUS
  Filled 2021-05-03: qty 0.4

## 2021-05-03 MED ORDER — ATORVASTATIN CALCIUM 40 MG PO TABS
40.0000 mg | ORAL_TABLET | Freq: Every day | ORAL | Status: DC
  Administered 2021-05-04: 40 mg via ORAL
  Filled 2021-05-03: qty 1

## 2021-05-03 MED ORDER — ASPIRIN 325 MG PO TABS
325.0000 mg | ORAL_TABLET | Freq: Every day | ORAL | Status: DC
  Administered 2021-05-03 – 2021-05-04 (×2): 325 mg via ORAL
  Filled 2021-05-03 (×2): qty 1

## 2021-05-03 MED ORDER — HYDROCOD POLST-CPM POLST ER 10-8 MG/5ML PO SUER: 5.0000 mL | Freq: Two times a day (BID) | ORAL | Status: DC | PRN

## 2021-05-03 MED ORDER — SODIUM CHLORIDE 0.9 % IV BOLUS
1000.0000 mL | Freq: Once | INTRAVENOUS | Status: AC
  Administered 2021-05-03: 1000 mL via INTRAVENOUS

## 2021-05-03 MED ORDER — NIRMATRELVIR/RITONAVIR (PAXLOVID)TABLET
3.0000 | ORAL_TABLET | Freq: Two times a day (BID) | ORAL | Status: AC
  Administered 2021-05-03 – 2021-05-04 (×2): 3 via ORAL
  Filled 2021-05-03: qty 30

## 2021-05-03 NOTE — H&P (Signed)
History and Physical    [name] [name] KYH:[id number] DOB: [birth year] DOA: 05/03/2021  PCP: Pcp, No  Patient coming from: Home  Chief Complaint: dyspnea  HPI: [name] [name] is a 65 y.o. female with medical history significant of HTN, DM2, HLD, CAD. Presenting with dyspnea and chest tightness. Her symptoms started 4 days ago. She felt some slight tightness in her central chest. She was already scheduled to follow up with her PCP that day. She mentioned her symptoms in addition to some mild dyspnea. She was found to be COVID positive and sent home on paxlovid. Her symptoms have worsened since that visit. She complains of increased dyspnea and cough. She reports that there are several members of her family that have the same symptoms. She decided to come to the ED for help. She denies any other aggravating or alleviating factors.  ED Course: Found to be COVID positive. Found to hyponatremic. Given 1L NS. TRH was called for admission.   Review of Systems:  Denies palpitations, lightheadedness, dizziness, syncopal episodes, N/V/D.  Review of systems is otherwise negative for all not mentioned in HPI.   PMHx Past Medical History:  Diagnosis Date   Diabetes mellitus without complication (HCC)    High cholesterol    Hypertension     PSHx Past Surgical History:  Procedure Laterality Date   CORONARY ARTERY BYPASS GRAFT  2011    SocHx  reports that she has never smoked. She has never been exposed to tobacco smoke. She has never used smokeless tobacco. She reports that she does not drink alcohol and does not use drugs.  Allergies  Allergen Reactions   Sulfamethoxazole-Trimethoprim Itching    FamHx History reviewed. No pertinent family history.  Prior to Admission medications   Medication Sig Start Date End Date Taking? Authorizing Provider  aspirin 325 MG tablet Take by mouth.    [provider]  atorvastatin (LIPITOR) 40 MG tablet Take 40 mg by mouth daily. 02/15/21   [provider]  ferrous gluconate (FERGON) 240 (27 FE) MG tablet Take by mouth.    [provider]  fluticasone (FLONASE) 50 MCG/ACT nasal spray Place 1 spray into both nostrils daily. 01/19/21   [provider]  hydrochlorothiazide (HYDRODIURIL) 25 MG tablet Take 25 mg by mouth daily. 03/30/21   [provider]  losartan (COZAAR) 100 MG tablet Take 100 mg by mouth daily. 02/15/21   [provider]  metFORMIN (GLUCOPHAGE) 1000 MG tablet Take 1,000 mg by mouth 2 (two) times daily. 02/15/21   [provider]  nateglinide (STARLIX) 60 MG tablet SMARTSIG:1 Tablet(s) By Mouth 02/15/21   [provider]  pioglitazone (ACTOS) 15 MG tablet Take 15 mg by mouth daily. 02/15/21   [provider]    Physical Exam: Vitals:   05/03/21 0530 05/03/21 0630 05/03/21 1215 05/03/21 1432  BP: (!) 156/83 (!) 147/77 (!) 147/75 128/75  Pulse: 77 79 79 75  Resp: (!) 24 (!) 21 20 20   Temp:    98.4 F (36.9 C)  TempSrc:    Axillary  SpO2: 97% 100% 100% 100%    General: 65 y.o. female resting in bed in NAD Eyes: PERRL, normal sclera ENMT: Nares patent w/o discharge, orophaynx clear, dentition normal, ears w/o discharge/lesions/ulcers Neck: Supple, trachea midline Cardiovascular: RRR, +S1, S2, no m/g/r, equal pulses throughout, reproducible pain on exam Respiratory: CTABL, no w/r/r, normal WOB GI: BS+, NDNT, no masses noted, no organomegaly noted MSK: No e/c/c Skin: No rashes, bruises, ulcerations noted Neuro:  A&O x 3, no focal deficits Psyc: Appropriate interaction and affect, calm/cooperative  Labs on Admission: I have personally reviewed following labs and imaging studies  CBC: Recent Labs  Lab 05/03/21 0231  WBC 3.9*  NEUTROABS 2.0  HGB 11.0*  HCT 32.1*  MCV 91.2  PLT 215   Basic Metabolic Panel: Recent Labs  Lab 05/03/21 0231 05/03/21 1210  NA 118* 120*  K 4.4 4.5  CL 87* 89*  CO2 21* 21*  GLUCOSE 79 110*  BUN 25* 20  CREATININE  1.62* 1.33*  CALCIUM 8.6* 8.5*   GFR: CrCl cannot be calculated (Unknown ideal weight.). Liver Function Tests: Recent Labs  Lab 05/03/21 0231  AST 35  ALT 15  ALKPHOS 45  BILITOT 0.3  PROT 7.2  ALBUMIN 3.7   No results for input(s): LIPASE, AMYLASE in the last 168 hours. No results for input(s): AMMONIA in the last 168 hours. Coagulation Profile: No results for input(s): INR, PROTIME in the last 168 hours. Cardiac Enzymes: No results for input(s): CKTOTAL, CKMB, CKMBINDEX, TROPONINI in the last 168 hours. BNP (last 3 results) No results for input(s): PROBNP in the last 8760 hours. HbA1C: No results for input(s): HGBA1C in the last 72 hours. CBG: No results for input(s): GLUCAP in the last 168 hours. Lipid Profile: No results for input(s): CHOL, HDL, LDLCALC, TRIG, CHOLHDL, LDLDIRECT in the last 72 hours. Thyroid Function Tests: No results for input(s): TSH, T4TOTAL, FREET4, T3FREE, THYROIDAB in the last 72 hours. Anemia Panel: No results for input(s): VITAMINB12, FOLATE, FERRITIN, TIBC, IRON, RETICCTPCT in the last 72 hours. Urine analysis:    Component Value Date/Time   COLORURINE YELLOW 12/28/2009 1121   APPEARANCEUR HAZY (A) 12/28/2009 1121   LABSPEC 1.014 12/28/2009 1121   PHURINE 6.0 12/28/2009 1121   GLUCOSEU NEGATIVE 12/28/2009 1121   HGBUR SMALL (A) 12/28/2009 1121   BILIRUBINUR NEGATIVE 12/28/2009 1121   KETONESUR NEGATIVE 12/28/2009 1121   PROTEINUR NEGATIVE 12/28/2009 1121   UROBILINOGEN 0.2 12/28/2009 1121   NITRITE NEGATIVE 12/28/2009 1121   LEUKOCYTESUR SMALL (A) 12/28/2009 1121    Radiological Exams on Admission: DG Chest Port 1 View  Result Date: 05/03/2021 CLINICAL DATA:  Shortness of breath EXAM: PORTABLE CHEST 1 VIEW COMPARISON:  01/28/2010 FINDINGS: Unchanged cardiac and mediastinal contours, when accounting for differences in technique. Status post median sternotomy and CABG. No focal pulmonary opacity. No pleural effusion or pneumothorax. No  acute osseous abnormality. IMPRESSION: No active disease. Electronically Signed   By: Wiliam Ke M.D.   On: 05/03/2021 03:06    EKG: Independently reviewed. Sinus, no st elevations  Assessment/Plan Hyponatremia AKI     - admit to inpt, tele     - she takes HCTZ at home; discontinue that med     - NS 100cc/hr, q6h renal function panel, limit Na+ increase to 8pt per 24hrs     - watch I&O     - baseline SCR is 1 - 1.2; she was 1.6 at admission; fluids, follow  COVID 19     - finish paxlovid (one more day)     - follow inflammatory markers     - combivent, guaifenesin     - O2 as needed  Chest pain     - trp neg x 2; EKG is ok     - partial reproducible on exam; midsternal radiating to shoulders, dull, most noticeable w/ deep breaths     - check d-dimer; if elevated check CTA  HTN     -  stop HCTZ d/t hypoNa+     - holding ARB d/t AKI     - PRN metoprolol for now  DM2     - SSI, DM diet, A1c, glucose checks  HLD     - statin  DVT prophylaxis: lovenox  Code Status: FULL  Family Communication: None at bedside  Consults called: None   Status is: Inpatient  Remains inpatient appropriate because:Inpatient level of care appropriate due to severity of illness  Dispo: The patient is from: Home              Anticipated d/c is to: Home              Patient currently is not medically stable to d/c.   Difficult to place patient No  Time spent coordinating admission: 60 minutes  Fallen Crisostomo A Terri Rorrer DO [facility]  If 7PM-7AM, please contact night-coverage [email or web address]  05/03/2021, 4:25 PM

## 2021-05-03 NOTE — ED Provider Notes (Signed)
[facility] EMERGENCY DEPARTMENT Provider Note   CSN: [id number] Arrival date & time: 05/03/21  0151     History Chief Complaint  Patient presents with   Fatigue   Shortness of Breath    [name] [name] is a 65 y.o. female.  Patient is a 65 year old female with past medical history of CABG 10 years ago, diabetes, hypertension.  Patient presenting today for evaluation of shortness of breath and chest tightness.  Patient was diagnosed with COVID-19 3 days ago.  She describes the sensation of tightness in her chest and difficulty breathing.  She denies fevers or chills.  Several other family members are sick at home with similar symptoms.  She is currently taking Paxlovid.  Patient [place] and speaks only [place]  History taken with the use of the translator tablet.  The history is provided by the patient.  Shortness of Breath Severity:  Moderate Onset quality:  Gradual Duration:  3 days Timing:  Constant Progression:  Worsening Chronicity:  New Context: activity and URI   Relieved by:  Nothing Worsened by:  Nothing     No past medical history on file.  There are no problems to display for this patient.      OB History   No obstetric history on file.     No family history on file.     Home Medications Prior to Admission medications   Not on File    Allergies    Patient has no allergy information on record.  Review of Systems   Review of Systems  Respiratory:  Positive for shortness of breath.   All other systems reviewed and are negative.  Physical Exam Updated Vital Signs BP (!) 151/75   Pulse 73   Temp 98.3 F (36.8 C) (Oral)   Resp (!) 22   SpO2 99%   Physical Exam Vitals and nursing note reviewed.  Constitutional:      General: She is not in acute distress.    Appearance: She is well-developed. She is not diaphoretic.  HENT:     Head: Normocephalic and atraumatic.  Cardiovascular:     Rate and Rhythm: Normal rate and  regular rhythm.     Heart sounds: No murmur heard.   No friction rub. No gallop.  Pulmonary:     Effort: Pulmonary effort is normal. No respiratory distress.     Breath sounds: Normal breath sounds. No wheezing.  Abdominal:     General: Bowel sounds are normal. There is no distension.     Palpations: Abdomen is soft.     Tenderness: There is no abdominal tenderness.  Musculoskeletal:        General: Normal range of motion.     Cervical back: Normal range of motion and neck supple.     Right lower leg: No tenderness. No edema.     Left lower leg: No tenderness. No edema.  Skin:    General: Skin is warm and dry.  Neurological:     General: No focal deficit present.     Mental Status: She is alert and oriented to person, place, and time.    ED Results / Procedures / Treatments   Labs (all labs ordered are listed, but only abnormal results are displayed) Labs Reviewed  COMPREHENSIVE METABOLIC PANEL  CBC WITH DIFFERENTIAL/PLATELET  BRAIN NATRIURETIC PEPTIDE  TROPONIN I (HIGH SENSITIVITY)    EKG EKG Interpretation  Date/Time:  Monday May 03 2021 02:31:29 EDT Ventricular Rate:  75 PR Interval:  168  QRS Duration: 80 QT Interval:  381 QTC Calculation: 426 R Axis:   34 Text Interpretation: Sinus rhythm Abnrm T, consider ischemia, anterolateral lds Confirmed by Geoffery Lyons ([id number]) on 05/03/2021 2:41:52 AM  Radiology No results found.  Procedures Procedures   Medications Ordered in ED Medications  sodium chloride 0.9 % bolus 1,000 mL (has no administration in time range)    ED Course  I have reviewed the triage vital signs and the nursing notes.  Pertinent labs & imaging results that were available during my care of the patient were reviewed by me and considered in my medical decision making (see chart for details).    MDM Rules/Calculators/A&P  Patient is a 65 year old female with medical history as per HPI.  She was recently diagnosed with COVID-19 and  presents this evening with weakness, difficulty breathing, and tightness in her chest.  Her EKG is unchanged and troponin is negative.  Laboratory studies unremarkable with the exception of a sodium of 118.  Patient was given normal saline here in the ER but will require admission for correction of this.  I have spoken with Dr. Toniann Fail who agrees to admit.  CRITICAL CARE Performed by: Geoffery Lyons Total critical care time: 40 minutes Critical care time was exclusive of separately billable procedures and treating other patients. Critical care was necessary to treat or prevent imminent or life-threatening deterioration. Critical care was time spent personally by me on the following activities: development of treatment plan with patient and/or surrogate as well as nursing, discussions with consultants, evaluation of patient's response to treatment, examination of patient, obtaining history from patient or surrogate, ordering and performing treatments and interventions, ordering and review of laboratory studies, ordering and review of radiographic studies, pulse oximetry and re-evaluation of patient's condition.   Final Clinical Impression(s) / ED Diagnoses Final diagnoses:  None    Rx / DC Orders ED Discharge Orders     None        Geoffery Lyons, MD 05/03/21 [phone]

## 2021-05-03 NOTE — ED Notes (Signed)
Report given to Tiffany with [facility] 

## 2021-05-03 NOTE — ED Triage Notes (Signed)
Pt BIB EMS from home c/o shob and fatigue that worsened today. Pt was dx with COVID on Thursday. Currently on paxlovid. 100% RA. LSC. VSS.

## 2021-05-04 ENCOUNTER — Other Ambulatory Visit: Payer: Self-pay

## 2021-05-04 LAB — RENAL FUNCTION PANEL
Albumin: 3.8 g/dL (ref 3.5–5.0)
Albumin: 3.9 g/dL (ref 3.5–5.0)
Albumin: 4 g/dL (ref 3.5–5.0)
Anion gap: 9 (ref 5–15)
Anion gap: 11 (ref 5–15)
Anion gap: 10 (ref 5–15)
BUN: 22 mg/dL (ref 8–23)
BUN: 22 mg/dL (ref 8–23)
BUN: 29 mg/dL — ABNORMAL HIGH (ref 8–23)
CO2: 22 mmol/L (ref 22–32)
CO2: 22 mmol/L (ref 22–32)
CO2: 22 mmol/L (ref 22–32)
Calcium: 8.8 mg/dL — ABNORMAL LOW (ref 8.9–10.3)
Calcium: 9.3 mg/dL (ref 8.9–10.3)
Calcium: 8.8 mg/dL — ABNORMAL LOW (ref 8.9–10.3)
Chloride: 92 mmol/L — ABNORMAL LOW (ref 98–111)
Chloride: 88 mmol/L — ABNORMAL LOW (ref 98–111)
Chloride: 88 mmol/L — ABNORMAL LOW (ref 98–111)
Creatinine, Ser: 1.47 mg/dL — ABNORMAL HIGH (ref 0.44–1.00)
Creatinine, Ser: 1.76 mg/dL — ABNORMAL HIGH (ref 0.44–1.00)
Creatinine, Ser: 1.82 mg/dL — ABNORMAL HIGH (ref 0.44–1.00)
GFR, Estimated: 39 mL/min — ABNORMAL LOW (ref >60)
GFR, Estimated: 32 mL/min — ABNORMAL LOW (ref >60)
GFR, Estimated: 30 mL/min — ABNORMAL LOW (ref >60)
Glucose, Bld: 84 mg/dL (ref 70–99)
Glucose, Bld: 205 mg/dL — ABNORMAL HIGH (ref 70–99)
Glucose, Bld: 104 mg/dL — ABNORMAL HIGH (ref 70–99)
Phosphorus: 3.1 mg/dL (ref 2.5–4.6)
Phosphorus: 3.1 mg/dL (ref 2.5–4.6)
Phosphorus: 3.8 mg/dL (ref 2.5–4.6)
Potassium: 3.9 mmol/L (ref 3.5–5.1)
Potassium: 4.5 mmol/L (ref 3.5–5.1)
Potassium: 4.5 mmol/L (ref 3.5–5.1)
Sodium: 123 mmol/L — ABNORMAL LOW (ref 135–145)
Sodium: 121 mmol/L — ABNORMAL LOW (ref 135–145)
Sodium: 120 mmol/L — ABNORMAL LOW (ref 135–145)

## 2021-05-04 LAB — CBC WITH DIFFERENTIAL/PLATELET
Abs Immature Granulocytes: 0.04 10*3/uL (ref 0.00–0.07)
Basophils Absolute: 0 10*3/uL (ref 0.0–0.1)
Basophils Relative: 1 %
Eosinophils Absolute: 0.1 10*3/uL (ref 0.0–0.5)
Eosinophils Relative: 3 %
HCT: 31.1 % — ABNORMAL LOW (ref 36.0–46.0)
Hemoglobin: 10.7 g/dL — ABNORMAL LOW (ref 12.0–15.0)
Immature Granulocytes: 1 %
Lymphocytes Relative: 28 %
Lymphs Abs: 1.1 10*3/uL (ref 0.7–4.0)
MCH: 31 pg (ref 26.0–34.0)
MCHC: 34.4 g/dL (ref 30.0–36.0)
MCV: 90.1 fL (ref 80.0–100.0)
Monocytes Absolute: 0.6 10*3/uL (ref 0.1–1.0)
Monocytes Relative: 14 %
Neutro Abs: 2.1 10*3/uL (ref 1.7–7.7)
Neutrophils Relative %: 53 %
Platelets: 227 10*3/uL (ref 150–400)
RBC: 3.45 MIL/uL — ABNORMAL LOW (ref 3.87–5.11)
RDW: 12.4 % (ref 11.5–15.5)
WBC: 3.9 10*3/uL — ABNORMAL LOW (ref 4.0–10.5)
nRBC: 0 % (ref 0.0–0.2)

## 2021-05-04 LAB — C-REACTIVE PROTEIN: CRP: <0.5 mg/dL (ref <1.0)

## 2021-05-04 LAB — FERRITIN: Ferritin: 81 ng/mL (ref 11–307)

## 2021-05-04 LAB — D-DIMER, QUANTITATIVE: D-Dimer, Quant: 0.51 ug/mL-FEU — ABNORMAL HIGH (ref 0.00–0.50)

## 2021-05-04 LAB — GLUCOSE, CAPILLARY
Glucose-Capillary: 104 mg/dL — ABNORMAL HIGH (ref 70–99)
Glucose-Capillary: 113 mg/dL — ABNORMAL HIGH (ref 70–99)

## 2021-05-04 NOTE — Plan of Care (Signed)

## 2021-05-04 NOTE — Progress Notes (Signed)
Pt will d/c at 1900 by son-n-law. SRP, RN

## 2021-05-04 NOTE — Plan of Care (Signed)
  Problem: Education: Goal: Knowledge of General Education information will improve Description Including pain rating scale, medication(s)/side effects and non-pharmacologic comfort measures Outcome: Progressing   Problem: Health Behavior/Discharge Planning: Goal: Ability to manage health-related needs will improve Outcome: Progressing   

## 2021-05-04 NOTE — Progress Notes (Signed)
Discharge to home instructions reviewed with pt acknowledge understanding.

## 2021-05-04 NOTE — Discharge Summary (Signed)
Physician Discharge Summary  [name] [name] GGY:[id number] DOB: 1956/07/17 DOA: 05/03/2021  PCP: Pcp, No  Admit date: 05/03/2021 Discharge date: 05/04/2021  Admitted From: Home Disposition: Home  Recommendations for Outpatient Follow-up:  Follow up with PCP in 1-2 weeks Please obtain BMP/CBC in one week  Home Health: None Equipment/Devices: None  Discharge Condition: Stable CODE STATUS: Full Diet recommendation: Diabetic diet as tolerated  Brief/Interim Summary: [name] [name] is a 65 y.o. female with medical history significant of HTN, DM2, HLD, CAD. Presenting with dyspnea and chest tightness. Her symptoms started 4 days ago. She felt some slight tightness in her central chest. She was already scheduled to follow up with her PCP that day. She mentioned her symptoms in addition to some mild dyspnea. She was found to be COVID positive and sent home on paxlovid. Her symptoms have worsened since that visit. She complains of increased dyspnea and cough. She reports that there are several members of her family that have the same symptoms. She decided to come to the ED for help. She denies any other aggravating or alleviating factors.   Assessment & Plan:   Symptomatic hypovolemia with associated hyponatremia, mild -Patient symptoms resolved much more quickly than expected with IV fluids and supportive care -Sodium initially rebounded quite quickly but now appears to have leveled off, repeat labs with PCP next week, in the meantime encourage patient to increase p.o. intake given her poor p.o. intake over the past 2 weeks likely in the setting of acute COVID infection -Discontinue HCTZ   COVID 19, questionably acute infection, diagnosed prior to admission -Completed paxlovid today -Likely etiology for patient's symptoms, poor p.o. intake dehydration and hyponatremia -No longer requiring oxygen at rest or with ambulation  Pleuritic chest pain - In the setting of above COVID-pneumonia -  Unremarkable chest x-ray    HTN -Hold HCTZ in the setting of hyponatremia -Resume other home medications, follow with PCP repeat labs later this week or early next week as scheduled   Non-insulin-dependent diabetes mellitus type 2, well controlled Lab Results  Component Value Date   HGBA1C 6.3 (H) 05/03/2021   HLD -Statin held while on paxlovid, resume once evaluated by PCP    Discharge Instructions  Discharge Instructions     Diet - low sodium heart healthy   Complete by: As directed    Increase activity slowly   Complete by: As directed       Allergies as of 05/04/2021       Reactions   Sulfamethoxazole-trimethoprim Itching        Medication List     STOP taking these medications    atorvastatin 40 MG tablet Commonly known as: LIPITOR   hydrochlorothiazide 25 MG tablet Commonly known as: HYDRODIURIL       TAKE these medications    aspirin 325 MG tablet Take 325 mg by mouth daily.   ferrous gluconate 240 (27 FE) MG tablet Commonly known as: FERGON Take 240 mg by mouth daily.   fluticasone 50 MCG/ACT nasal spray Commonly known as: FLONASE Place 1 spray into both nostrils daily.   losartan 100 MG tablet Commonly known as: COZAAR Take 100 mg by mouth daily.   metFORMIN 1000 MG tablet Commonly known as: GLUCOPHAGE Take 1,000 mg by mouth 2 (two) times daily.   nateglinide 60 MG tablet Commonly known as: STARLIX Take 60 mg by mouth 3 (three) times daily with meals.   Paxlovid (150/100) 10 x 150 MG & 10 x 100MG  Tbpk Generic drug: nirmatrelvir &  ritonavir Take 2 tablets by mouth 2 (two) times daily.   pioglitazone 15 MG tablet Commonly known as: ACTOS Take 15 mg by mouth daily.        Allergies  Allergen Reactions   Sulfamethoxazole-Trimethoprim Itching    Consultations: None  Procedures/Studies: DG Chest Port 1 View  Result Date: 05/03/2021 CLINICAL DATA:  Shortness of breath EXAM: PORTABLE CHEST 1 VIEW COMPARISON:  01/28/2010  FINDINGS: Unchanged cardiac and mediastinal contours, when accounting for differences in technique. Status post median sternotomy and CABG. No focal pulmonary opacity. No pleural effusion or pneumothorax. No acute osseous abnormality. IMPRESSION: No active disease. Electronically Signed   By: Wiliam Ke M.D.   On: 05/03/2021 03:06     Subjective: No acute issues or events overnight, patient's feeling markedly improved, improving much more rapidly than expected now back to baseline and requesting discharge home which is certainly reasonable given resolution of symptoms and improved labs.   Discharge Exam: Vitals:   05/04/21 0232 05/04/21 1207  BP: 136/78 (!) 141/73  Pulse:  69  Resp: 16 18  Temp: 98.7 F (37.1 C) 98 F (36.7 C)  SpO2: 97% 100%   Vitals:   05/03/21 1850 05/03/21 2233 05/04/21 0232 05/04/21 1207  BP: (!) 132/93 140/70 136/78 (!) 141/73  Pulse: 87 69  69  Resp: 20 20 16 18   Temp: (!) 97.5 F (36.4 C) 98.4 F (36.9 C) 98.7 F (37.1 C) 98 F (36.7 C)  TempSrc: Axillary Oral Oral Oral  SpO2: 100% 99% 97% 100%    General: Pt is alert, awake, not in acute distress Cardiovascular: RRR, S1/S2 +, no rubs, no gallops Respiratory: CTA bilaterally, no wheezing, no rhonchi Abdominal: Soft, NT, ND, bowel sounds + Extremities: no edema, no cyanosis    The results of significant diagnostics from this hospitalization (including imaging, microbiology, ancillary and laboratory) are listed below for reference.     Microbiology: No results found for this or any previous visit (from the past 240 hour(s)).   Labs: BNP (last 3 results) Recent Labs    05/03/21 0231  BNP 88.7   Basic Metabolic Panel: Recent Labs  Lab 05/03/21 0231 05/03/21 1210 05/03/21 1713 05/04/21 0351 05/04/21 1244  NA 118* 120* 126* 123* 121*  K 4.4 4.5 4.5 3.9 4.5  CL 87* 89* 94* 92* 88*  CO2 21* 21* 22 22 22   GLUCOSE 79 110* 110* 84 205*  BUN 25* 20 21 22 22   CREATININE 1.62* 1.33*  1.47* 1.47* 1.76*  CALCIUM 8.6* 8.5* 9.0 8.8* 9.3  PHOS  --   --   --  3.1 3.1   Liver Function Tests: Recent Labs  Lab 05/03/21 0231 05/03/21 1713 05/04/21 0351 05/04/21 1244  AST 35 36  --   --   ALT 15 15  --   --   ALKPHOS 45 46  --   --   BILITOT 0.3 0.6  --   --   PROT 7.2 7.6  --   --   ALBUMIN 3.7 3.8 3.8 3.9   No results for input(s): LIPASE, AMYLASE in the last 168 hours. No results for input(s): AMMONIA in the last 168 hours. CBC: Recent Labs  Lab 05/03/21 0231 05/04/21 0351  WBC 3.9* 3.9*  NEUTROABS 2.0 2.1  HGB 11.0* 10.7*  HCT 32.1* 31.1*  MCV 91.2 90.1  PLT 215 227   Cardiac Enzymes: No results for input(s): CKTOTAL, CKMB, CKMBINDEX, TROPONINI in the last 168 hours. BNP: Invalid input(s): POCBNP CBG:  Recent Labs  Lab 05/03/21 1711 05/03/21 2230 05/04/21 0745 05/04/21 1110  GLUCAP 104* 137* 104* 113*   D-Dimer Recent Labs    05/03/21 1713 05/04/21 0351  DDIMER 0.62* 0.51*   Hgb A1c Recent Labs    05/03/21 1713  HGBA1C 6.3*   Lipid Profile No results for input(s): CHOL, HDL, LDLCALC, TRIG, CHOLHDL, LDLDIRECT in the last 72 hours. Thyroid function studies No results for input(s): TSH, T4TOTAL, T3FREE, THYROIDAB in the last 72 hours.  Invalid input(s): FREET3 Anemia work up Recent Labs    05/03/21 1713 05/04/21 0351  FERRITIN 83 81   Urinalysis    Component Value Date/Time   COLORURINE YELLOW 12/28/2009 1121   APPEARANCEUR HAZY (A) 12/28/2009 1121   LABSPEC 1.014 12/28/2009 1121   PHURINE 6.0 12/28/2009 1121   GLUCOSEU NEGATIVE 12/28/2009 1121   HGBUR SMALL (A) 12/28/2009 1121   BILIRUBINUR NEGATIVE 12/28/2009 1121   KETONESUR NEGATIVE 12/28/2009 1121   PROTEINUR NEGATIVE 12/28/2009 1121   UROBILINOGEN 0.2 12/28/2009 1121   NITRITE NEGATIVE 12/28/2009 1121   LEUKOCYTESUR SMALL (A) 12/28/2009 1121   Sepsis Labs Invalid input(s): PROCALCITONIN,  WBC,  LACTICIDVEN Microbiology No results found for this or any previous  visit (from the past 240 hour(s)).   Time coordinating discharge: Over 30 minutes  SIGNED:   Azucena Fallen, DO [facility] 05/04/2021, 2:52 PM Pager   If 7PM-7AM, please contact night-coverage [email or web address]

## 2021-05-04 NOTE — Progress Notes (Signed)
Son n law called earlier will pick up at 1900 or so. Coming from [place]. SRP, RN

## 2021-12-17 IMAGING — DX DG CHEST 1V PORT
1 series · 1 of 1 positions shown · non-contrast
Comparison: 01/28/2010

CLINICAL DATA: Shortness of breath

EXAM:
PORTABLE CHEST 1 VIEW

[chest ap]
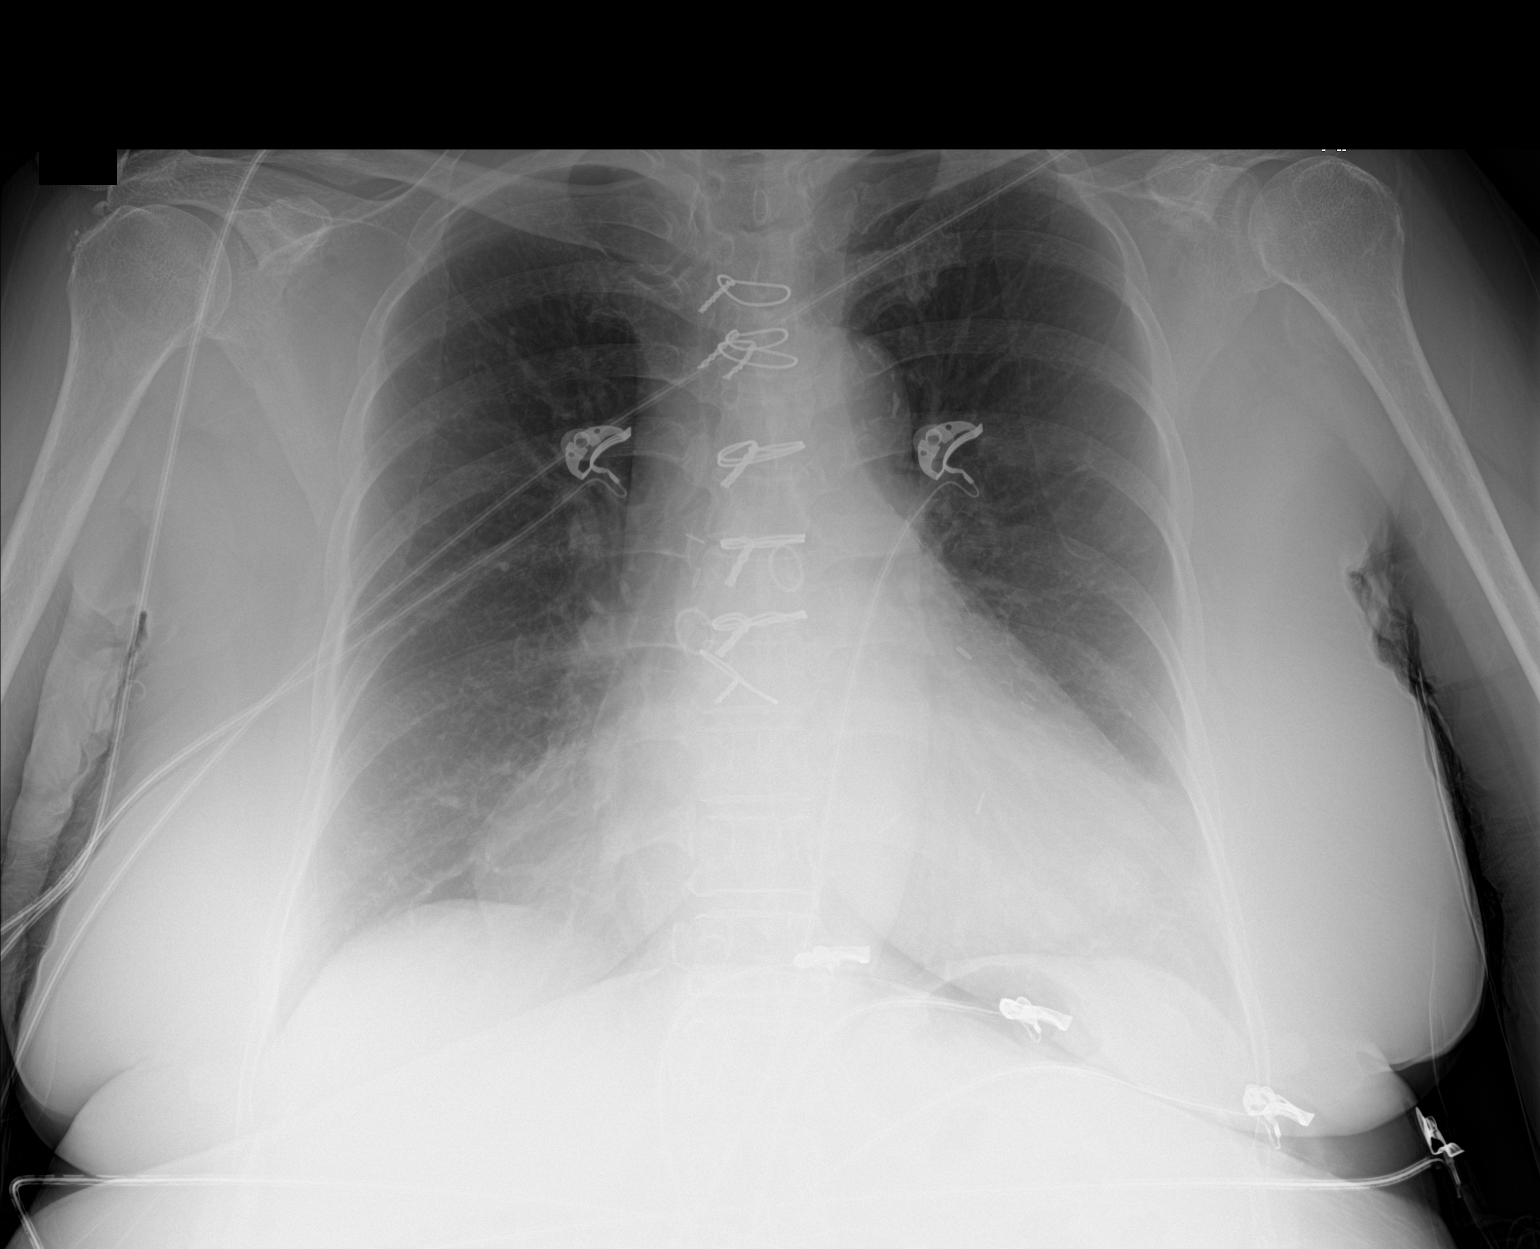

[1 of 1 positions shown; findings below may reference images not displayed]

FINDINGS: Unchanged cardiac and mediastinal contours, when accounting for
differences in technique. Status post median sternotomy and CABG. No
focal pulmonary opacity. No pleural effusion or pneumothorax. No
acute osseous abnormality.
IMPRESSION: No active disease.
